# Patient Record
Sex: Female | Born: 1950 | Race: White | Hispanic: No | Marital: Single
Health system: Southern US, Community
[De-identification: ages and names within clinical notes are randomized; demographics above are authoritative.]

---

## 2020-01-27 DIAGNOSIS — Z6828 Body mass index (BMI) 28.0-28.9, adult: Secondary | ICD-10-CM | POA: Diagnosis not present

## 2020-01-27 DIAGNOSIS — I10 Essential (primary) hypertension: Secondary | ICD-10-CM | POA: Diagnosis not present

## 2020-01-27 DIAGNOSIS — E785 Hyperlipidemia, unspecified: Secondary | ICD-10-CM | POA: Diagnosis not present

## 2020-01-27 DIAGNOSIS — Z23 Encounter for immunization: Secondary | ICD-10-CM | POA: Diagnosis not present

## 2020-04-21 DIAGNOSIS — Z1231 Encounter for screening mammogram for malignant neoplasm of breast: Secondary | ICD-10-CM | POA: Diagnosis not present

## 2020-06-27 DIAGNOSIS — Z79899 Other long term (current) drug therapy: Secondary | ICD-10-CM | POA: Diagnosis not present

## 2020-06-27 DIAGNOSIS — E669 Obesity, unspecified: Secondary | ICD-10-CM | POA: Diagnosis not present

## 2020-06-27 DIAGNOSIS — I10 Essential (primary) hypertension: Secondary | ICD-10-CM | POA: Diagnosis not present

## 2020-06-27 DIAGNOSIS — E039 Hypothyroidism, unspecified: Secondary | ICD-10-CM | POA: Diagnosis not present

## 2020-06-27 DIAGNOSIS — E785 Hyperlipidemia, unspecified: Secondary | ICD-10-CM | POA: Diagnosis not present

## 2020-06-27 DIAGNOSIS — Z6827 Body mass index (BMI) 27.0-27.9, adult: Secondary | ICD-10-CM | POA: Diagnosis not present

## 2020-12-02 DIAGNOSIS — M65271 Calcific tendinitis, right ankle and foot: Secondary | ICD-10-CM | POA: Diagnosis not present

## 2020-12-05 DIAGNOSIS — M19072 Primary osteoarthritis, left ankle and foot: Secondary | ICD-10-CM | POA: Diagnosis not present

## 2020-12-05 DIAGNOSIS — M25572 Pain in left ankle and joints of left foot: Secondary | ICD-10-CM | POA: Diagnosis not present

## 2020-12-19 DIAGNOSIS — H6983 Other specified disorders of Eustachian tube, bilateral: Secondary | ICD-10-CM | POA: Diagnosis not present

## 2020-12-19 DIAGNOSIS — I10 Essential (primary) hypertension: Secondary | ICD-10-CM | POA: Diagnosis not present

## 2020-12-19 DIAGNOSIS — Z0000 Encounter for general adult medical examination without abnormal findings: Secondary | ICD-10-CM | POA: Diagnosis not present

## 2020-12-19 DIAGNOSIS — M180 Bilateral primary osteoarthritis of first carpometacarpal joints: Secondary | ICD-10-CM | POA: Diagnosis not present

## 2020-12-19 DIAGNOSIS — E782 Mixed hyperlipidemia: Secondary | ICD-10-CM | POA: Diagnosis not present

## 2020-12-19 DIAGNOSIS — F4321 Adjustment disorder with depressed mood: Secondary | ICD-10-CM | POA: Diagnosis not present

## 2020-12-19 DIAGNOSIS — Z79899 Other long term (current) drug therapy: Secondary | ICD-10-CM | POA: Diagnosis not present

## 2020-12-19 DIAGNOSIS — M75102 Unspecified rotator cuff tear or rupture of left shoulder, not specified as traumatic: Secondary | ICD-10-CM | POA: Diagnosis not present

## 2020-12-19 DIAGNOSIS — E039 Hypothyroidism, unspecified: Secondary | ICD-10-CM | POA: Diagnosis not present

## 2020-12-26 DIAGNOSIS — R109 Unspecified abdominal pain: Secondary | ICD-10-CM | POA: Diagnosis not present

## 2020-12-26 DIAGNOSIS — R1011 Right upper quadrant pain: Secondary | ICD-10-CM | POA: Diagnosis not present

## 2020-12-26 DIAGNOSIS — Z9049 Acquired absence of other specified parts of digestive tract: Secondary | ICD-10-CM | POA: Diagnosis not present

## 2021-01-04 DIAGNOSIS — E785 Hyperlipidemia, unspecified: Secondary | ICD-10-CM | POA: Diagnosis not present

## 2021-01-04 DIAGNOSIS — Z6829 Body mass index (BMI) 29.0-29.9, adult: Secondary | ICD-10-CM | POA: Diagnosis not present

## 2021-01-04 DIAGNOSIS — E039 Hypothyroidism, unspecified: Secondary | ICD-10-CM | POA: Diagnosis not present

## 2021-01-04 DIAGNOSIS — I10 Essential (primary) hypertension: Secondary | ICD-10-CM | POA: Diagnosis not present

## 2021-06-13 DIAGNOSIS — Z79899 Other long term (current) drug therapy: Secondary | ICD-10-CM | POA: Diagnosis not present

## 2021-06-13 DIAGNOSIS — Z6829 Body mass index (BMI) 29.0-29.9, adult: Secondary | ICD-10-CM | POA: Diagnosis not present

## 2021-06-13 DIAGNOSIS — E040 Nontoxic diffuse goiter: Secondary | ICD-10-CM | POA: Diagnosis not present

## 2021-06-13 DIAGNOSIS — R1314 Dysphagia, pharyngoesophageal phase: Secondary | ICD-10-CM | POA: Diagnosis not present

## 2021-06-13 DIAGNOSIS — E039 Hypothyroidism, unspecified: Secondary | ICD-10-CM | POA: Diagnosis not present

## 2021-06-13 DIAGNOSIS — K219 Gastro-esophageal reflux disease without esophagitis: Secondary | ICD-10-CM | POA: Diagnosis not present

## 2021-06-13 DIAGNOSIS — I10 Essential (primary) hypertension: Secondary | ICD-10-CM | POA: Diagnosis not present

## 2021-06-18 DIAGNOSIS — E040 Nontoxic diffuse goiter: Secondary | ICD-10-CM | POA: Diagnosis not present

## 2021-06-18 DIAGNOSIS — R1310 Dysphagia, unspecified: Secondary | ICD-10-CM | POA: Diagnosis not present

## 2021-06-18 DIAGNOSIS — R590 Localized enlarged lymph nodes: Secondary | ICD-10-CM | POA: Diagnosis not present

## 2021-06-18 DIAGNOSIS — E890 Postprocedural hypothyroidism: Secondary | ICD-10-CM | POA: Diagnosis not present

## 2021-06-18 DIAGNOSIS — E041 Nontoxic single thyroid nodule: Secondary | ICD-10-CM | POA: Diagnosis not present

## 2021-06-28 DIAGNOSIS — E041 Nontoxic single thyroid nodule: Secondary | ICD-10-CM | POA: Diagnosis not present

## 2021-06-28 DIAGNOSIS — C73 Malignant neoplasm of thyroid gland: Secondary | ICD-10-CM | POA: Diagnosis not present

## 2021-08-09 DIAGNOSIS — M545 Low back pain, unspecified: Secondary | ICD-10-CM | POA: Diagnosis not present

## 2021-08-21 ENCOUNTER — Ambulatory Visit (INDEPENDENT_AMBULATORY_CARE_PROVIDER_SITE_OTHER): Payer: Medicare PPO | Admitting: Otolaryngology

## 2021-08-21 ENCOUNTER — Other Ambulatory Visit: Payer: Self-pay

## 2021-08-21 DIAGNOSIS — E041 Nontoxic single thyroid nodule: Secondary | ICD-10-CM | POA: Diagnosis not present

## 2021-08-21 NOTE — Progress Notes (Signed)
HPI: Deborah Greene is a 70 y.o. female who presents is referred by [facility] family physicians for evaluation of thyroid nodule.  Apparently patient was having dysphagia and underwent an ultrasound of her thyroid area as her doctors felt like they palpated a mass in this area..  Patient reports that she has had a previous total thyroidectomy performed 27 years ago for thyroid cancer.  On the initial ultrasound performed 2 months ago there was noted to be a hypoechoic nodule measuring 0.7 cm in size that could represent residual thyroid tissue with a small nodule.  An ultrasound guided fine-needle aspirate was recommended and this was performed on 07/06/2021 but they were unable to obtain adequate tissue for diagnosis and was reported as insufficient for diagnosis.  Specimen clinical size measured 0.7 x 0.6 x 0.4cm.  With history of thyroid cancer 27 years ago status post total thyroidectomy. Patient has had no hoarseness.  No past medical history on file. Otherwise negative Social History   Socioeconomic History   Marital status: Single    Spouse name: Not on file   Number of children: Not on file   Years of education: Not on file   Highest education level: Not on file  Occupational History   Not on file  Tobacco Use   Smoking status: Not on file   Smokeless tobacco: Not on file  Substance and Sexual Activity   Alcohol use: Not on file   Drug use: Not on file   Sexual activity: Not on file  Other Topics Concern   Not on file  Social History Narrative   Not on file   Social Determinants of Health   Financial Resource Strain: Not on file  Food Insecurity: Not on file  Transportation Needs: Not on file  Physical Activity: Not on file  Stress: Not on file  Social Connections: Not on file   No family history on file. Not on File Prior to Admission medications   Not on File     Positive ROS: Otherwise negative  All other systems have been reviewed and were otherwise negative with  the exception of those mentioned in the HPI and as above.  Physical Exam: Constitutional: Alert, well-appearing, no acute distress Ears: External ears without lesions or tenderness. Ear canals are clear bilaterally with intact, clear TMs.  Nasal: External nose without lesions. . Clear nasal passages Oral: Lips and gums without lesions. Tongue and palate mucosa without lesions. Posterior oropharynx clear. Neck: On palpation of the neck I do not appreciate any discrete masses or nodules.  No supraclavicular adenopathy noted and no adenopathy noted along the jugular chain of lymph nodes. Respiratory: Breathing comfortably  Skin: No facial/neck lesions or rash noted.  Procedures  Assessment: Small 0.7 cm nodule noted in the bed of the left thyroid region status post thyroidectomy performed 27 years ago for history of thyroid cancer although I have no report on this.  Plan: I will plan on referring the patient to Dr. Gerrit Friends concerning further recommendations and surgery if indicated.   Narda Bonds, MD   CC:

## 2021-08-24 ENCOUNTER — Telehealth (INDEPENDENT_AMBULATORY_CARE_PROVIDER_SITE_OTHER): Payer: Self-pay | Admitting: Otolaryngology

## 2021-08-24 NOTE — Telephone Encounter (Signed)
Called Deborah Greene and left a message for her to call me back next week. I had a chance to review the ultrasound that she had performed with the radiologist who reviewed this with me and this showed some residual thyroid tissue in the left thyroid bed with a small 4 to 6 mm nodule with no bad characteristics on the ultrasound but since she has had history of cancer suggested repeat ultrasound in 6 months to see if it enlarges or removal of the nodule. I have referred her for follow-up with Dr. Gerrit Friends.

## 2021-08-27 ENCOUNTER — Ambulatory Visit (INDEPENDENT_AMBULATORY_CARE_PROVIDER_SITE_OTHER): Payer: Self-pay | Admitting: Otolaryngology

## 2021-08-29 ENCOUNTER — Telehealth (INDEPENDENT_AMBULATORY_CARE_PROVIDER_SITE_OTHER): Payer: Self-pay | Admitting: Otolaryngology

## 2021-08-29 NOTE — Telephone Encounter (Signed)
I talked to Dr. Gerrit Friends as well as to the patient today concerning follow-up on her thyroid nodule.  Reviewed with her that this appeared benign on ultrasound and that Dr. Ardine Eng office will contact her concerning appropriate follow-up.

## 2021-09-12 DIAGNOSIS — E785 Hyperlipidemia, unspecified: Secondary | ICD-10-CM | POA: Diagnosis not present

## 2021-09-12 DIAGNOSIS — Z809 Family history of malignant neoplasm, unspecified: Secondary | ICD-10-CM | POA: Diagnosis not present

## 2021-09-12 DIAGNOSIS — K219 Gastro-esophageal reflux disease without esophagitis: Secondary | ICD-10-CM | POA: Diagnosis not present

## 2021-09-12 DIAGNOSIS — Z8249 Family history of ischemic heart disease and other diseases of the circulatory system: Secondary | ICD-10-CM | POA: Diagnosis not present

## 2021-09-12 DIAGNOSIS — Z833 Family history of diabetes mellitus: Secondary | ICD-10-CM | POA: Diagnosis not present

## 2021-09-12 DIAGNOSIS — I10 Essential (primary) hypertension: Secondary | ICD-10-CM | POA: Diagnosis not present

## 2021-09-12 DIAGNOSIS — H269 Unspecified cataract: Secondary | ICD-10-CM | POA: Diagnosis not present

## 2021-09-12 DIAGNOSIS — Z811 Family history of alcohol abuse and dependence: Secondary | ICD-10-CM | POA: Diagnosis not present

## 2021-09-12 DIAGNOSIS — E039 Hypothyroidism, unspecified: Secondary | ICD-10-CM | POA: Diagnosis not present

## 2022-01-02 ENCOUNTER — Other Ambulatory Visit: Payer: Self-pay | Admitting: Surgery

## 2022-01-02 DIAGNOSIS — E041 Nontoxic single thyroid nodule: Secondary | ICD-10-CM

## 2022-01-03 ENCOUNTER — Ambulatory Visit
Admission: RE | Admit: 2022-01-03 | Discharge: 2022-01-03 | Disposition: A | Discharge status: Home / Self Care | Payer: Medicare PPO | Source: Ambulatory Visit | Attending: Surgery | Admitting: Surgery

## 2022-01-03 DIAGNOSIS — Z79899 Other long term (current) drug therapy: Secondary | ICD-10-CM | POA: Diagnosis not present

## 2022-01-03 DIAGNOSIS — I10 Essential (primary) hypertension: Secondary | ICD-10-CM | POA: Diagnosis not present

## 2022-01-03 DIAGNOSIS — E890 Postprocedural hypothyroidism: Secondary | ICD-10-CM | POA: Diagnosis not present

## 2022-01-03 DIAGNOSIS — C73 Malignant neoplasm of thyroid gland: Secondary | ICD-10-CM | POA: Diagnosis not present

## 2022-01-03 DIAGNOSIS — E785 Hyperlipidemia, unspecified: Secondary | ICD-10-CM | POA: Diagnosis not present

## 2022-01-03 DIAGNOSIS — E039 Hypothyroidism, unspecified: Secondary | ICD-10-CM | POA: Diagnosis not present

## 2022-01-03 DIAGNOSIS — E041 Nontoxic single thyroid nodule: Secondary | ICD-10-CM

## 2022-01-03 DIAGNOSIS — Z1331 Encounter for screening for depression: Secondary | ICD-10-CM | POA: Diagnosis not present

## 2022-01-03 DIAGNOSIS — Z2821 Immunization not carried out because of patient refusal: Secondary | ICD-10-CM | POA: Diagnosis not present

## 2022-01-03 DIAGNOSIS — Z131 Encounter for screening for diabetes mellitus: Secondary | ICD-10-CM | POA: Diagnosis not present

## 2022-01-03 DIAGNOSIS — Z0000 Encounter for general adult medical examination without abnormal findings: Secondary | ICD-10-CM | POA: Diagnosis not present

## 2022-01-30 DIAGNOSIS — Z85850 Personal history of malignant neoplasm of thyroid: Secondary | ICD-10-CM | POA: Diagnosis not present

## 2022-01-30 DIAGNOSIS — R221 Localized swelling, mass and lump, neck: Secondary | ICD-10-CM | POA: Diagnosis not present

## 2022-01-30 DIAGNOSIS — R1310 Dysphagia, unspecified: Secondary | ICD-10-CM | POA: Diagnosis not present

## 2022-01-30 DIAGNOSIS — E890 Postprocedural hypothyroidism: Secondary | ICD-10-CM | POA: Diagnosis not present

## 2022-02-01 DIAGNOSIS — Z1231 Encounter for screening mammogram for malignant neoplasm of breast: Secondary | ICD-10-CM | POA: Diagnosis not present

## 2022-02-21 ENCOUNTER — Other Ambulatory Visit (HOSPITAL_COMMUNITY): Payer: Self-pay | Admitting: *Deleted

## 2022-02-21 DIAGNOSIS — R1310 Dysphagia, unspecified: Secondary | ICD-10-CM

## 2022-02-28 ENCOUNTER — Ambulatory Visit (HOSPITAL_COMMUNITY)
Admission: RE | Admit: 2022-02-28 | Discharge: 2022-02-28 | Disposition: A | Discharge status: Home / Self Care | Payer: Medicare PPO | Source: Ambulatory Visit | Attending: Surgery | Admitting: Surgery

## 2022-02-28 ENCOUNTER — Other Ambulatory Visit: Payer: Self-pay

## 2022-02-28 DIAGNOSIS — Z6829 Body mass index (BMI) 29.0-29.9, adult: Secondary | ICD-10-CM | POA: Diagnosis not present

## 2022-02-28 DIAGNOSIS — R1310 Dysphagia, unspecified: Secondary | ICD-10-CM | POA: Diagnosis not present

## 2022-02-28 DIAGNOSIS — R1319 Other dysphagia: Secondary | ICD-10-CM | POA: Insufficient documentation

## 2022-02-28 DIAGNOSIS — K5900 Constipation, unspecified: Secondary | ICD-10-CM | POA: Diagnosis not present

## 2022-02-28 DIAGNOSIS — K5909 Other constipation: Secondary | ICD-10-CM | POA: Diagnosis not present

## 2022-02-28 DIAGNOSIS — M545 Low back pain, unspecified: Secondary | ICD-10-CM | POA: Diagnosis not present

## 2022-02-28 DIAGNOSIS — R1031 Right lower quadrant pain: Secondary | ICD-10-CM | POA: Diagnosis not present

## 2022-02-28 NOTE — Progress Notes (Signed)
Modified Barium Swallow Progress Note ? ?Patient Details  ?Name: Deborah Greene ?MRN: [id number] ?Date of Birth: September 14, 1951 ? ?Today's Date: 02/28/2022 ? ?Modified Barium Swallow completed.  Full report located under Chart Review in the Imaging Section. ? ?Brief recommendations include the following: ? ?Clinical Impression ? Pt's oropharyngeal swallow was [facility]. She has good timing, efficiency, and safety with no aspiration observed. There were no findings to suggest why she may be having her subjective symptoms. No further SLP f/u indicated at this time - would continue with regular solids and thin liquids as able. ?  ?Swallow Evaluation Recommendations ? ? Recommended Consults: Consider GI evaluation;Consider ENT evaluation ? ? SLP Diet Recommendations: Regular solids;Thin liquid ? ? Liquid Administration via: Cup;Straw ? ? Medication Administration: Whole meds with liquid ? ? Supervision: Patient able to self feed ? ?   ? ?   ? ? Oral Care Recommendations: Oral care BID ? ?   ? ? ? ?Osie Bond., M.A. CCC-SLP ?Acute Rehabilitation Services ?Pager [phone] ?Office [phone] ? ?02/28/2022,11:56 AM ?

## 2022-03-04 NOTE — Progress Notes (Signed)
Swallow study is normal.  No significant abnormality to explain choking sensation.  No restrictions on diet. ? ?tmg ? ?Armandina Gemma, MD ?[facility] ?A [facility] practice ?Office: [phone] ?

## 2022-03-06 DIAGNOSIS — Z6829 Body mass index (BMI) 29.0-29.9, adult: Secondary | ICD-10-CM | POA: Diagnosis not present

## 2022-03-06 DIAGNOSIS — K5909 Other constipation: Secondary | ICD-10-CM | POA: Diagnosis not present

## 2022-03-06 DIAGNOSIS — J302 Other seasonal allergic rhinitis: Secondary | ICD-10-CM | POA: Diagnosis not present

## 2022-03-14 DIAGNOSIS — M5416 Radiculopathy, lumbar region: Secondary | ICD-10-CM | POA: Diagnosis not present

## 2022-03-29 DIAGNOSIS — K581 Irritable bowel syndrome with constipation: Secondary | ICD-10-CM | POA: Diagnosis not present

## 2022-04-05 DIAGNOSIS — M549 Dorsalgia, unspecified: Secondary | ICD-10-CM | POA: Diagnosis not present

## 2022-04-15 DIAGNOSIS — R109 Unspecified abdominal pain: Secondary | ICD-10-CM | POA: Diagnosis not present

## 2022-04-15 DIAGNOSIS — K5900 Constipation, unspecified: Secondary | ICD-10-CM | POA: Diagnosis not present

## 2022-05-02 DIAGNOSIS — M7062 Trochanteric bursitis, left hip: Secondary | ICD-10-CM | POA: Diagnosis not present

## 2022-05-02 DIAGNOSIS — M533 Sacrococcygeal disorders, not elsewhere classified: Secondary | ICD-10-CM | POA: Diagnosis not present

## 2022-05-13 DIAGNOSIS — M533 Sacrococcygeal disorders, not elsewhere classified: Secondary | ICD-10-CM | POA: Diagnosis not present

## 2022-05-13 DIAGNOSIS — M461 Sacroiliitis, not elsewhere classified: Secondary | ICD-10-CM | POA: Diagnosis not present

## 2022-06-10 DIAGNOSIS — M48061 Spinal stenosis, lumbar region without neurogenic claudication: Secondary | ICD-10-CM | POA: Diagnosis not present

## 2022-06-10 DIAGNOSIS — M5116 Intervertebral disc disorders with radiculopathy, lumbar region: Secondary | ICD-10-CM | POA: Diagnosis not present

## 2022-06-10 DIAGNOSIS — M5117 Intervertebral disc disorders with radiculopathy, lumbosacral region: Secondary | ICD-10-CM | POA: Diagnosis not present

## 2022-06-10 DIAGNOSIS — M4726 Other spondylosis with radiculopathy, lumbar region: Secondary | ICD-10-CM | POA: Diagnosis not present

## 2022-06-10 DIAGNOSIS — M5416 Radiculopathy, lumbar region: Secondary | ICD-10-CM | POA: Diagnosis not present

## 2022-06-11 DIAGNOSIS — C73 Malignant neoplasm of thyroid gland: Secondary | ICD-10-CM | POA: Diagnosis not present

## 2022-06-11 DIAGNOSIS — N1832 Chronic kidney disease, stage 3b: Secondary | ICD-10-CM | POA: Diagnosis not present

## 2022-06-11 DIAGNOSIS — G8929 Other chronic pain: Secondary | ICD-10-CM | POA: Diagnosis not present

## 2022-06-11 DIAGNOSIS — E039 Hypothyroidism, unspecified: Secondary | ICD-10-CM | POA: Diagnosis not present

## 2022-06-11 DIAGNOSIS — M5442 Lumbago with sciatica, left side: Secondary | ICD-10-CM | POA: Diagnosis not present

## 2022-06-11 DIAGNOSIS — Z6827 Body mass index (BMI) 27.0-27.9, adult: Secondary | ICD-10-CM | POA: Diagnosis not present

## 2022-06-19 DIAGNOSIS — M5416 Radiculopathy, lumbar region: Secondary | ICD-10-CM | POA: Diagnosis not present

## 2022-07-14 DIAGNOSIS — M545 Low back pain, unspecified: Secondary | ICD-10-CM | POA: Diagnosis not present

## 2022-07-14 DIAGNOSIS — R634 Abnormal weight loss: Secondary | ICD-10-CM | POA: Diagnosis not present

## 2022-07-14 DIAGNOSIS — R110 Nausea: Secondary | ICD-10-CM | POA: Diagnosis not present

## 2022-07-14 DIAGNOSIS — R9431 Abnormal electrocardiogram [ECG] [EKG]: Secondary | ICD-10-CM | POA: Diagnosis not present

## 2022-07-14 DIAGNOSIS — Z882 Allergy status to sulfonamides status: Secondary | ICD-10-CM | POA: Diagnosis not present

## 2022-07-14 DIAGNOSIS — G8929 Other chronic pain: Secondary | ICD-10-CM | POA: Diagnosis not present

## 2022-07-14 DIAGNOSIS — F419 Anxiety disorder, unspecified: Secondary | ICD-10-CM | POA: Diagnosis not present

## 2022-07-14 DIAGNOSIS — R10816 Epigastric abdominal tenderness: Secondary | ICD-10-CM | POA: Diagnosis not present

## 2022-07-14 DIAGNOSIS — R1013 Epigastric pain: Secondary | ICD-10-CM | POA: Diagnosis not present

## 2022-07-15 DIAGNOSIS — R32 Unspecified urinary incontinence: Secondary | ICD-10-CM | POA: Diagnosis not present

## 2022-07-15 DIAGNOSIS — I10 Essential (primary) hypertension: Secondary | ICD-10-CM | POA: Diagnosis not present

## 2022-07-15 DIAGNOSIS — K219 Gastro-esophageal reflux disease without esophagitis: Secondary | ICD-10-CM | POA: Diagnosis not present

## 2022-07-15 DIAGNOSIS — Z809 Family history of malignant neoplasm, unspecified: Secondary | ICD-10-CM | POA: Diagnosis not present

## 2022-07-15 DIAGNOSIS — E890 Postprocedural hypothyroidism: Secondary | ICD-10-CM | POA: Diagnosis not present

## 2022-07-15 DIAGNOSIS — K581 Irritable bowel syndrome with constipation: Secondary | ICD-10-CM | POA: Diagnosis not present

## 2022-07-15 DIAGNOSIS — Z791 Long term (current) use of non-steroidal anti-inflammatories (NSAID): Secondary | ICD-10-CM | POA: Diagnosis not present

## 2022-07-15 DIAGNOSIS — M1990 Unspecified osteoarthritis, unspecified site: Secondary | ICD-10-CM | POA: Diagnosis not present

## 2022-07-15 DIAGNOSIS — E785 Hyperlipidemia, unspecified: Secondary | ICD-10-CM | POA: Diagnosis not present

## 2022-07-17 DIAGNOSIS — M5416 Radiculopathy, lumbar region: Secondary | ICD-10-CM | POA: Diagnosis not present

## 2022-07-19 DIAGNOSIS — J3489 Other specified disorders of nose and nasal sinuses: Secondary | ICD-10-CM | POA: Diagnosis not present

## 2022-07-19 DIAGNOSIS — Z882 Allergy status to sulfonamides status: Secondary | ICD-10-CM | POA: Diagnosis not present

## 2022-07-19 DIAGNOSIS — R109 Unspecified abdominal pain: Secondary | ICD-10-CM | POA: Diagnosis not present

## 2022-07-19 DIAGNOSIS — R002 Palpitations: Secondary | ICD-10-CM | POA: Diagnosis not present

## 2022-07-19 DIAGNOSIS — N281 Cyst of kidney, acquired: Secondary | ICD-10-CM | POA: Diagnosis not present

## 2022-07-19 DIAGNOSIS — M545 Low back pain, unspecified: Secondary | ICD-10-CM | POA: Diagnosis not present

## 2022-07-19 DIAGNOSIS — R634 Abnormal weight loss: Secondary | ICD-10-CM | POA: Diagnosis not present

## 2022-07-19 DIAGNOSIS — N183 Chronic kidney disease, stage 3 unspecified: Secondary | ICD-10-CM | POA: Diagnosis not present

## 2022-07-19 DIAGNOSIS — R112 Nausea with vomiting, unspecified: Secondary | ICD-10-CM | POA: Diagnosis not present

## 2022-07-19 DIAGNOSIS — R531 Weakness: Secondary | ICD-10-CM | POA: Diagnosis not present

## 2022-07-19 DIAGNOSIS — M5440 Lumbago with sciatica, unspecified side: Secondary | ICD-10-CM | POA: Diagnosis not present

## 2022-07-19 DIAGNOSIS — M533 Sacrococcygeal disorders, not elsewhere classified: Secondary | ICD-10-CM | POA: Diagnosis not present

## 2022-07-20 DIAGNOSIS — R1013 Epigastric pain: Secondary | ICD-10-CM | POA: Diagnosis not present

## 2022-07-20 DIAGNOSIS — R110 Nausea: Secondary | ICD-10-CM | POA: Diagnosis not present

## 2022-07-22 DIAGNOSIS — Z6827 Body mass index (BMI) 27.0-27.9, adult: Secondary | ICD-10-CM | POA: Diagnosis not present

## 2022-07-22 DIAGNOSIS — J90 Pleural effusion, not elsewhere classified: Secondary | ICD-10-CM | POA: Diagnosis not present

## 2022-07-22 DIAGNOSIS — R110 Nausea: Secondary | ICD-10-CM | POA: Diagnosis not present

## 2022-07-22 DIAGNOSIS — R634 Abnormal weight loss: Secondary | ICD-10-CM | POA: Diagnosis not present

## 2022-07-23 DIAGNOSIS — M5416 Radiculopathy, lumbar region: Secondary | ICD-10-CM | POA: Diagnosis not present

## 2022-07-23 DIAGNOSIS — M48061 Spinal stenosis, lumbar region without neurogenic claudication: Secondary | ICD-10-CM | POA: Diagnosis not present

## 2022-08-05 DIAGNOSIS — R634 Abnormal weight loss: Secondary | ICD-10-CM | POA: Diagnosis not present

## 2022-08-05 DIAGNOSIS — J984 Other disorders of lung: Secondary | ICD-10-CM | POA: Diagnosis not present

## 2022-08-05 DIAGNOSIS — J90 Pleural effusion, not elsewhere classified: Secondary | ICD-10-CM | POA: Diagnosis not present

## 2022-08-05 DIAGNOSIS — K760 Fatty (change of) liver, not elsewhere classified: Secondary | ICD-10-CM | POA: Diagnosis not present

## 2022-08-12 DIAGNOSIS — M5442 Lumbago with sciatica, left side: Secondary | ICD-10-CM | POA: Diagnosis not present

## 2022-08-12 DIAGNOSIS — Z6827 Body mass index (BMI) 27.0-27.9, adult: Secondary | ICD-10-CM | POA: Diagnosis not present

## 2022-08-12 DIAGNOSIS — E039 Hypothyroidism, unspecified: Secondary | ICD-10-CM | POA: Diagnosis not present

## 2022-08-12 DIAGNOSIS — D692 Other nonthrombocytopenic purpura: Secondary | ICD-10-CM | POA: Diagnosis not present

## 2022-08-12 DIAGNOSIS — M5136 Other intervertebral disc degeneration, lumbar region: Secondary | ICD-10-CM | POA: Diagnosis not present

## 2022-08-12 DIAGNOSIS — G8929 Other chronic pain: Secondary | ICD-10-CM | POA: Diagnosis not present

## 2022-08-19 DIAGNOSIS — M5442 Lumbago with sciatica, left side: Secondary | ICD-10-CM | POA: Diagnosis not present

## 2022-08-19 DIAGNOSIS — M9903 Segmental and somatic dysfunction of lumbar region: Secondary | ICD-10-CM | POA: Diagnosis not present

## 2022-08-21 DIAGNOSIS — M9903 Segmental and somatic dysfunction of lumbar region: Secondary | ICD-10-CM | POA: Diagnosis not present

## 2022-08-21 DIAGNOSIS — M5442 Lumbago with sciatica, left side: Secondary | ICD-10-CM | POA: Diagnosis not present

## 2022-08-26 DIAGNOSIS — M5442 Lumbago with sciatica, left side: Secondary | ICD-10-CM | POA: Diagnosis not present

## 2022-08-26 DIAGNOSIS — M9903 Segmental and somatic dysfunction of lumbar region: Secondary | ICD-10-CM | POA: Diagnosis not present

## 2022-08-27 DIAGNOSIS — M9903 Segmental and somatic dysfunction of lumbar region: Secondary | ICD-10-CM | POA: Diagnosis not present

## 2022-08-27 DIAGNOSIS — M5442 Lumbago with sciatica, left side: Secondary | ICD-10-CM | POA: Diagnosis not present

## 2022-08-29 DIAGNOSIS — M5442 Lumbago with sciatica, left side: Secondary | ICD-10-CM | POA: Diagnosis not present

## 2022-08-29 DIAGNOSIS — M9903 Segmental and somatic dysfunction of lumbar region: Secondary | ICD-10-CM | POA: Diagnosis not present

## 2022-09-03 DIAGNOSIS — M9903 Segmental and somatic dysfunction of lumbar region: Secondary | ICD-10-CM | POA: Diagnosis not present

## 2022-09-03 DIAGNOSIS — M5442 Lumbago with sciatica, left side: Secondary | ICD-10-CM | POA: Diagnosis not present

## 2022-09-04 DIAGNOSIS — M5442 Lumbago with sciatica, left side: Secondary | ICD-10-CM | POA: Diagnosis not present

## 2022-09-04 DIAGNOSIS — M9903 Segmental and somatic dysfunction of lumbar region: Secondary | ICD-10-CM | POA: Diagnosis not present

## 2022-09-10 DIAGNOSIS — M5442 Lumbago with sciatica, left side: Secondary | ICD-10-CM | POA: Diagnosis not present

## 2022-09-10 DIAGNOSIS — M9903 Segmental and somatic dysfunction of lumbar region: Secondary | ICD-10-CM | POA: Diagnosis not present

## 2022-09-16 DIAGNOSIS — Z6827 Body mass index (BMI) 27.0-27.9, adult: Secondary | ICD-10-CM | POA: Diagnosis not present

## 2022-09-16 DIAGNOSIS — M5442 Lumbago with sciatica, left side: Secondary | ICD-10-CM | POA: Diagnosis not present

## 2022-09-16 DIAGNOSIS — M9903 Segmental and somatic dysfunction of lumbar region: Secondary | ICD-10-CM | POA: Diagnosis not present

## 2022-09-16 DIAGNOSIS — R0609 Other forms of dyspnea: Secondary | ICD-10-CM | POA: Diagnosis not present

## 2022-09-16 DIAGNOSIS — M79604 Pain in right leg: Secondary | ICD-10-CM | POA: Diagnosis not present

## 2022-09-16 DIAGNOSIS — M7989 Other specified soft tissue disorders: Secondary | ICD-10-CM | POA: Diagnosis not present

## 2022-09-17 DIAGNOSIS — M79604 Pain in right leg: Secondary | ICD-10-CM | POA: Diagnosis not present

## 2022-09-17 DIAGNOSIS — M79651 Pain in right thigh: Secondary | ICD-10-CM | POA: Diagnosis not present

## 2022-09-17 DIAGNOSIS — M7989 Other specified soft tissue disorders: Secondary | ICD-10-CM | POA: Diagnosis not present

## 2022-09-18 DIAGNOSIS — M5442 Lumbago with sciatica, left side: Secondary | ICD-10-CM | POA: Diagnosis not present

## 2022-09-18 DIAGNOSIS — M9903 Segmental and somatic dysfunction of lumbar region: Secondary | ICD-10-CM | POA: Diagnosis not present

## 2022-09-23 DIAGNOSIS — M5442 Lumbago with sciatica, left side: Secondary | ICD-10-CM | POA: Diagnosis not present

## 2022-09-23 DIAGNOSIS — M9903 Segmental and somatic dysfunction of lumbar region: Secondary | ICD-10-CM | POA: Diagnosis not present

## 2022-09-26 DIAGNOSIS — M5442 Lumbago with sciatica, left side: Secondary | ICD-10-CM | POA: Diagnosis not present

## 2022-09-26 DIAGNOSIS — M9903 Segmental and somatic dysfunction of lumbar region: Secondary | ICD-10-CM | POA: Diagnosis not present

## 2022-10-01 DIAGNOSIS — M5442 Lumbago with sciatica, left side: Secondary | ICD-10-CM | POA: Diagnosis not present

## 2022-10-01 DIAGNOSIS — M9903 Segmental and somatic dysfunction of lumbar region: Secondary | ICD-10-CM | POA: Diagnosis not present

## 2022-10-08 DIAGNOSIS — M9903 Segmental and somatic dysfunction of lumbar region: Secondary | ICD-10-CM | POA: Diagnosis not present

## 2022-10-08 DIAGNOSIS — M5442 Lumbago with sciatica, left side: Secondary | ICD-10-CM | POA: Diagnosis not present

## 2022-10-22 DIAGNOSIS — M5442 Lumbago with sciatica, left side: Secondary | ICD-10-CM | POA: Diagnosis not present

## 2022-10-22 DIAGNOSIS — M9903 Segmental and somatic dysfunction of lumbar region: Secondary | ICD-10-CM | POA: Diagnosis not present

## 2022-11-19 DIAGNOSIS — M9903 Segmental and somatic dysfunction of lumbar region: Secondary | ICD-10-CM | POA: Diagnosis not present

## 2022-11-19 DIAGNOSIS — M5442 Lumbago with sciatica, left side: Secondary | ICD-10-CM | POA: Diagnosis not present

## 2022-12-17 DIAGNOSIS — M5442 Lumbago with sciatica, left side: Secondary | ICD-10-CM | POA: Diagnosis not present

## 2022-12-17 DIAGNOSIS — M9903 Segmental and somatic dysfunction of lumbar region: Secondary | ICD-10-CM | POA: Diagnosis not present

## 2023-01-14 DIAGNOSIS — M9903 Segmental and somatic dysfunction of lumbar region: Secondary | ICD-10-CM | POA: Diagnosis not present

## 2023-01-14 DIAGNOSIS — M5442 Lumbago with sciatica, left side: Secondary | ICD-10-CM | POA: Diagnosis not present

## 2023-01-16 ENCOUNTER — Other Ambulatory Visit: Payer: Self-pay | Admitting: Surgery

## 2023-01-16 DIAGNOSIS — E041 Nontoxic single thyroid nodule: Secondary | ICD-10-CM

## 2023-01-16 DIAGNOSIS — E890 Postprocedural hypothyroidism: Secondary | ICD-10-CM

## 2023-01-16 DIAGNOSIS — Z85850 Personal history of malignant neoplasm of thyroid: Secondary | ICD-10-CM

## 2023-01-23 ENCOUNTER — Ambulatory Visit
Admission: RE | Admit: 2023-01-23 | Discharge: 2023-01-23 | Disposition: A | Discharge status: Home / Self Care | Payer: Medicare PPO | Source: Ambulatory Visit | Attending: Surgery | Admitting: Surgery

## 2023-01-23 DIAGNOSIS — D485 Neoplasm of uncertain behavior of skin: Secondary | ICD-10-CM | POA: Diagnosis not present

## 2023-01-23 DIAGNOSIS — E890 Postprocedural hypothyroidism: Secondary | ICD-10-CM

## 2023-01-23 DIAGNOSIS — D2239 Melanocytic nevi of other parts of face: Secondary | ICD-10-CM | POA: Diagnosis not present

## 2023-01-23 DIAGNOSIS — Z85850 Personal history of malignant neoplasm of thyroid: Secondary | ICD-10-CM

## 2023-01-23 DIAGNOSIS — L570 Actinic keratosis: Secondary | ICD-10-CM | POA: Diagnosis not present

## 2023-01-23 DIAGNOSIS — E041 Nontoxic single thyroid nodule: Secondary | ICD-10-CM

## 2023-01-30 DIAGNOSIS — Z85850 Personal history of malignant neoplasm of thyroid: Secondary | ICD-10-CM | POA: Diagnosis not present

## 2023-01-30 DIAGNOSIS — R221 Localized swelling, mass and lump, neck: Secondary | ICD-10-CM | POA: Diagnosis not present

## 2023-01-30 DIAGNOSIS — Q892 Congenital malformations of other endocrine glands: Secondary | ICD-10-CM | POA: Diagnosis not present

## 2023-01-30 DIAGNOSIS — E890 Postprocedural hypothyroidism: Secondary | ICD-10-CM | POA: Diagnosis not present

## 2023-02-07 DIAGNOSIS — I10 Essential (primary) hypertension: Secondary | ICD-10-CM | POA: Diagnosis not present

## 2023-02-07 DIAGNOSIS — Z0000 Encounter for general adult medical examination without abnormal findings: Secondary | ICD-10-CM | POA: Diagnosis not present

## 2023-02-07 DIAGNOSIS — E785 Hyperlipidemia, unspecified: Secondary | ICD-10-CM | POA: Diagnosis not present

## 2023-02-07 DIAGNOSIS — E039 Hypothyroidism, unspecified: Secondary | ICD-10-CM | POA: Diagnosis not present

## 2023-02-07 DIAGNOSIS — Z79899 Other long term (current) drug therapy: Secondary | ICD-10-CM | POA: Diagnosis not present

## 2023-02-07 DIAGNOSIS — J302 Other seasonal allergic rhinitis: Secondary | ICD-10-CM | POA: Diagnosis not present

## 2023-02-07 DIAGNOSIS — N1832 Chronic kidney disease, stage 3b: Secondary | ICD-10-CM | POA: Diagnosis not present

## 2023-02-07 DIAGNOSIS — C73 Malignant neoplasm of thyroid gland: Secondary | ICD-10-CM | POA: Diagnosis not present

## 2023-02-07 DIAGNOSIS — D692 Other nonthrombocytopenic purpura: Secondary | ICD-10-CM | POA: Diagnosis not present

## 2023-02-11 DIAGNOSIS — M5442 Lumbago with sciatica, left side: Secondary | ICD-10-CM | POA: Diagnosis not present

## 2023-02-11 DIAGNOSIS — M9903 Segmental and somatic dysfunction of lumbar region: Secondary | ICD-10-CM | POA: Diagnosis not present

## 2023-02-13 DIAGNOSIS — Z1231 Encounter for screening mammogram for malignant neoplasm of breast: Secondary | ICD-10-CM | POA: Diagnosis not present

## 2023-03-11 DIAGNOSIS — M5442 Lumbago with sciatica, left side: Secondary | ICD-10-CM | POA: Diagnosis not present

## 2023-03-11 DIAGNOSIS — M9903 Segmental and somatic dysfunction of lumbar region: Secondary | ICD-10-CM | POA: Diagnosis not present

## 2023-04-08 DIAGNOSIS — M9903 Segmental and somatic dysfunction of lumbar region: Secondary | ICD-10-CM | POA: Diagnosis not present

## 2023-04-08 DIAGNOSIS — M5442 Lumbago with sciatica, left side: Secondary | ICD-10-CM | POA: Diagnosis not present

## 2023-05-06 DIAGNOSIS — M9903 Segmental and somatic dysfunction of lumbar region: Secondary | ICD-10-CM | POA: Diagnosis not present

## 2023-05-06 DIAGNOSIS — M5442 Lumbago with sciatica, left side: Secondary | ICD-10-CM | POA: Diagnosis not present

## 2023-06-02 DIAGNOSIS — M5442 Lumbago with sciatica, left side: Secondary | ICD-10-CM | POA: Diagnosis not present

## 2023-06-02 DIAGNOSIS — M9903 Segmental and somatic dysfunction of lumbar region: Secondary | ICD-10-CM | POA: Diagnosis not present

## 2023-06-30 DIAGNOSIS — M5442 Lumbago with sciatica, left side: Secondary | ICD-10-CM | POA: Diagnosis not present

## 2023-06-30 DIAGNOSIS — M9903 Segmental and somatic dysfunction of lumbar region: Secondary | ICD-10-CM | POA: Diagnosis not present

## 2023-07-25 IMAGING — RF DG SWALLOWING FUNCTION
12 of 17 series · 12 of 24 positions shown · non-contrast
Comparison: None.

CLINICAL DATA: Dysphagia.

EXAM:
MODIFIED BARIUM SWALLOW
TECHNIQUE: Different consistencies of barium were administered orally to the
patient by the Speech Pathologist. Imaging of the pharynx was
performed in the lateral projection. The radiologist was present in
the fluoroscopy room for this study, providing personal supervision.
FLUOROSCOPY:
Fluoroscopy Time:  1 minute 50 seconds
Radiation Exposure Index (if provided by the fluoroscopic device):
23.57 mGy

[Series 1: run · 1 of 23 frames shown (1 of 12)]
[frame 20/23]
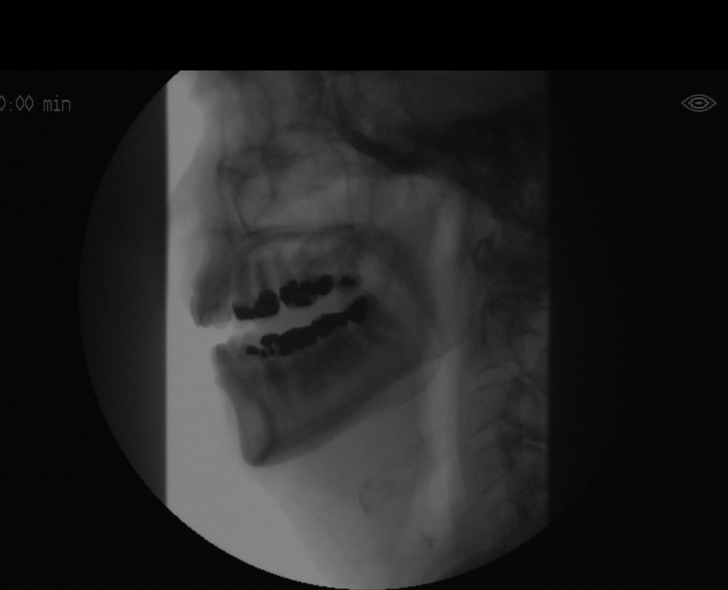

[Series 3: run · 1 of 121 frames shown (2 of 12)]
[frame 23/121]
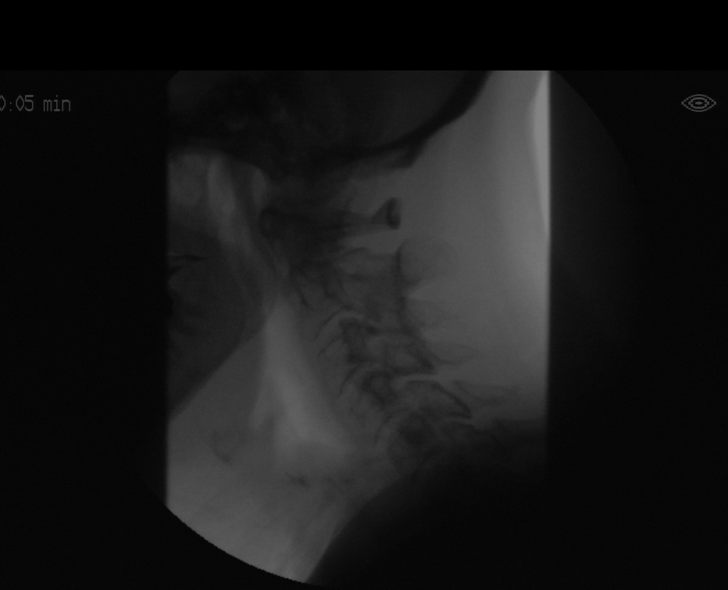

[Series 4: run · 1 of 292 frames shown (3 of 12)]
[frame 249/292]
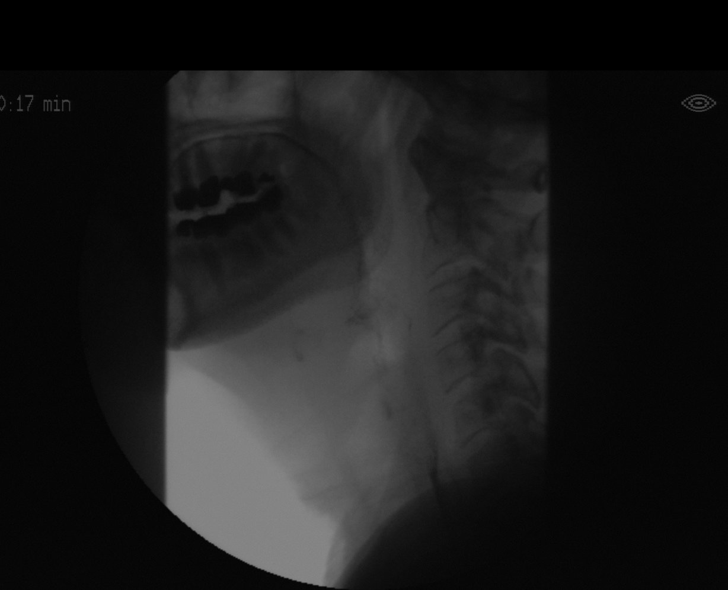

[Series 6: run · 1 of 49 frames shown (4 of 12)]
[frame 8/49]
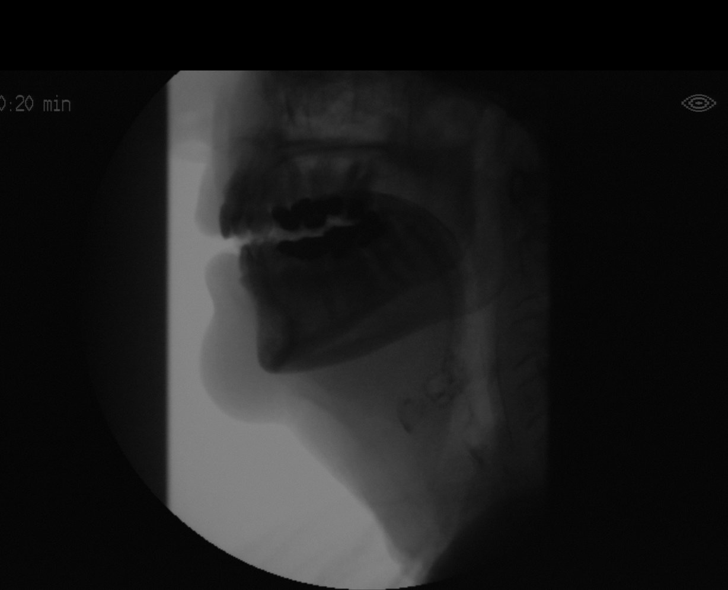

[Series 7: run · 1 of 336 frames shown (5 of 12)]
[frame 169/336]
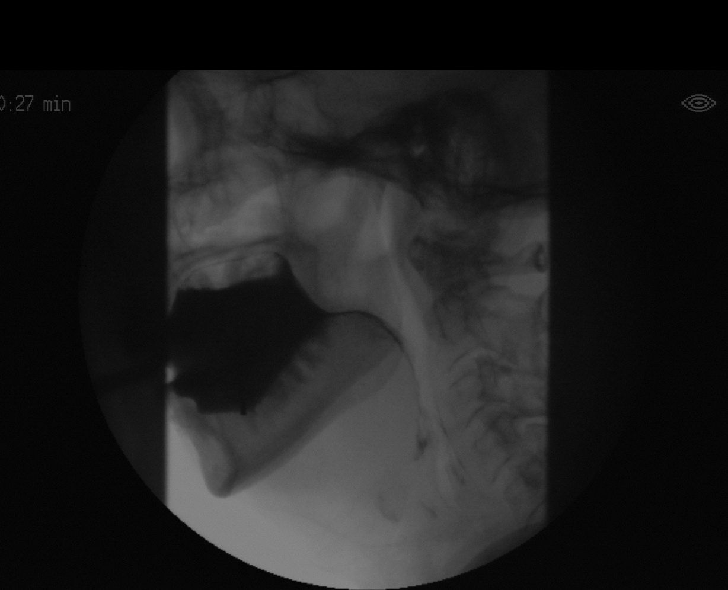

[Series 9: run · 1 of 125 frames shown (6 of 12)]
[frame 19/125]
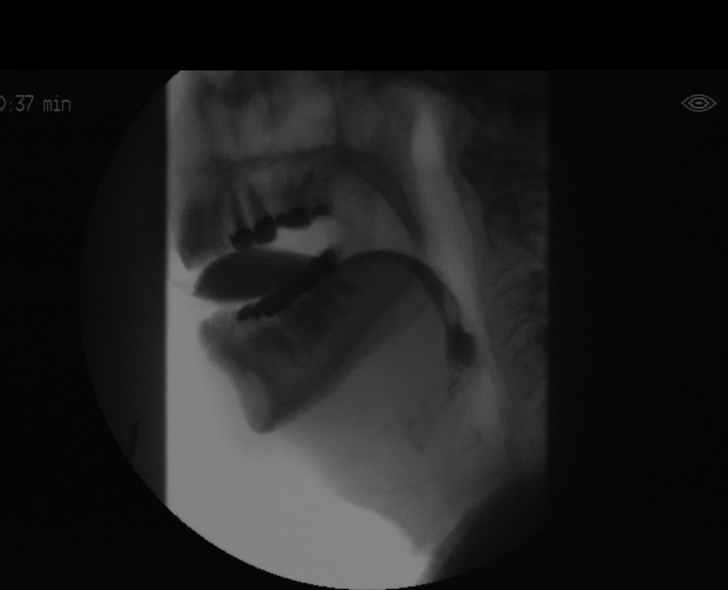

[Series 10: run · 1 of 266 frames shown (7 of 12)]
[frame 187/266]
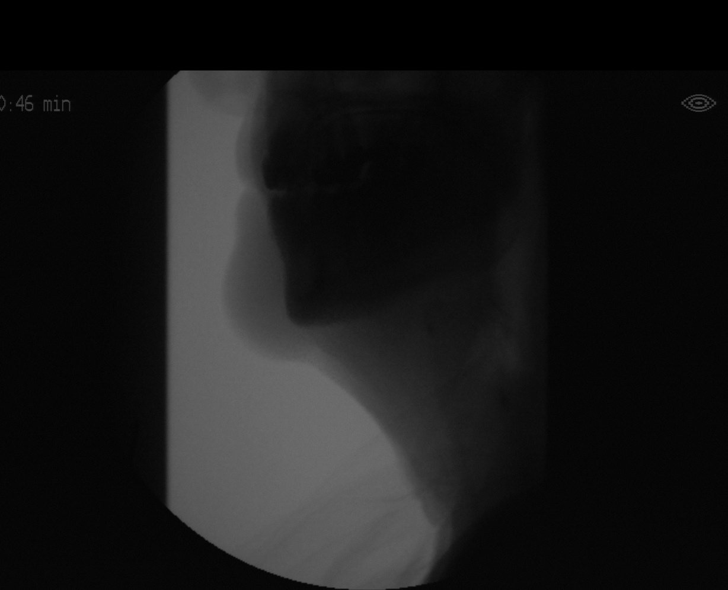

[Series 12: run · 1 of 274 frames shown (8 of 12)]
[frame 42/274]
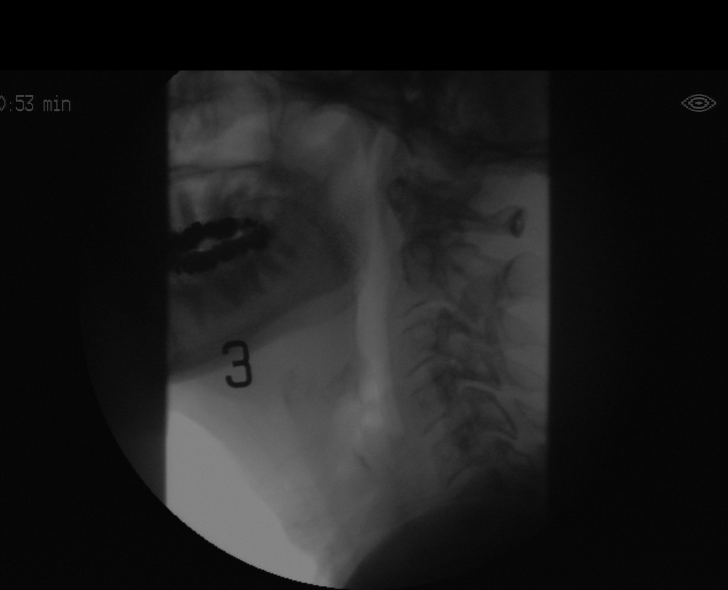

[Series 13: run · 1 of 159 frames shown (9 of 12)]
[frame 136/159]
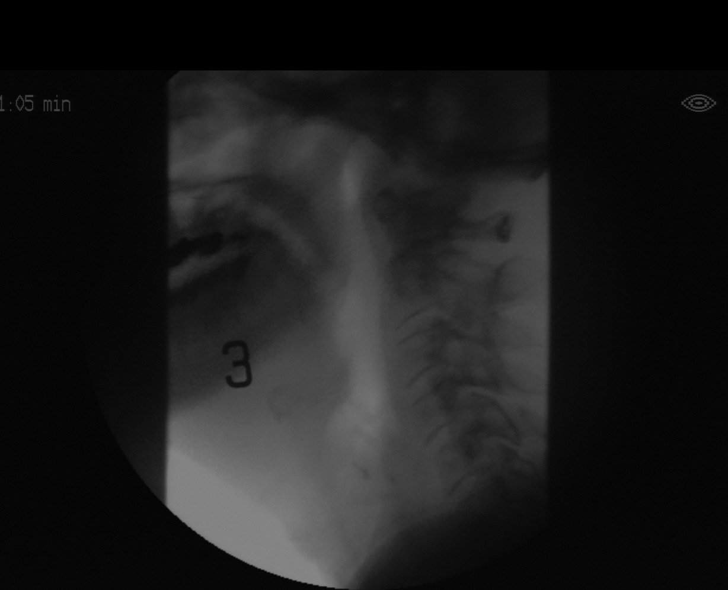

[Series 14: run · 1 of 122 frames shown (10 of 12)]
[frame 104/122]
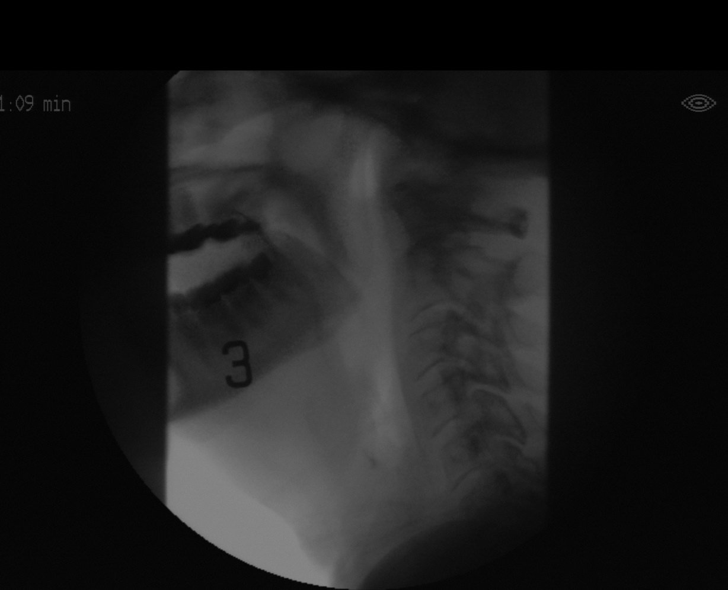

[Series 16: run · 1 of 373 frames shown (11 of 12)]
[frame 127/373]
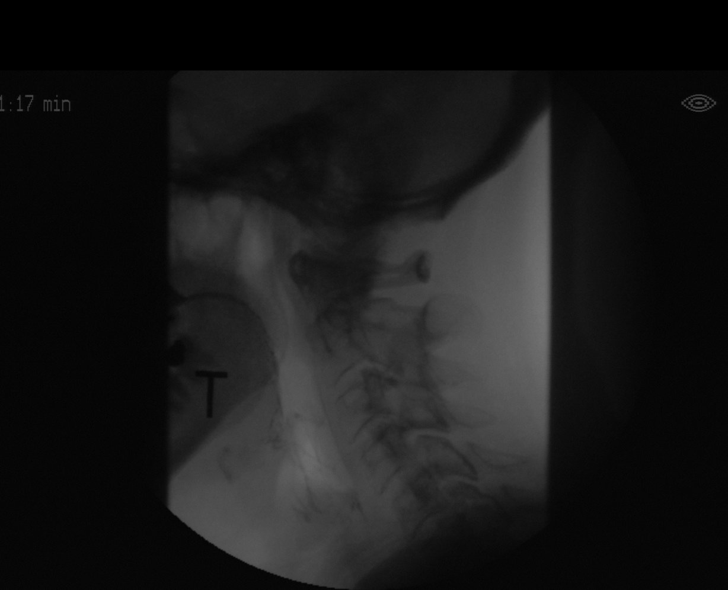

[Series 17: run · 1 of 752 frames shown (12 of 12)]
[frame 704/752]
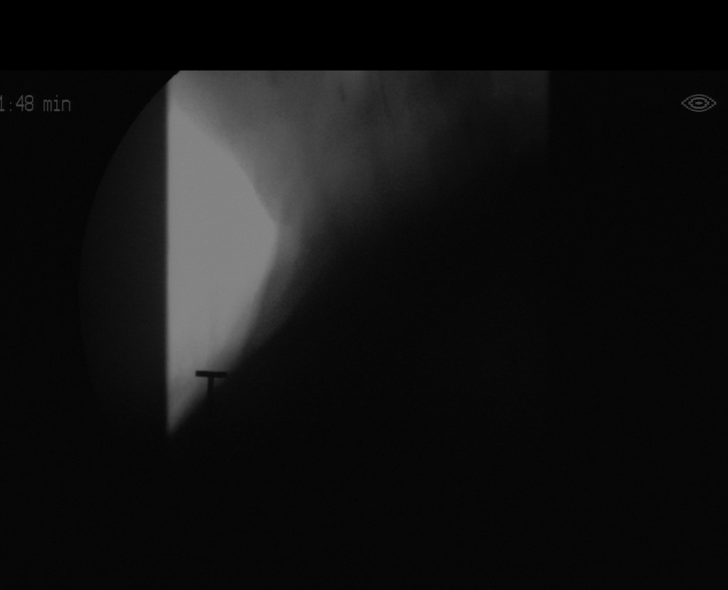

[12 of 24 positions shown; findings below may reference images not displayed]

FINDINGS: No aspiration with attempted consistencies including thin liquid
barium, honey thick, nectar thick, puree, and solid.
IMPRESSION: No aspiration.

Please refer to the Speech Pathologists report for complete details
and recommendations.

## 2023-07-30 DIAGNOSIS — M9903 Segmental and somatic dysfunction of lumbar region: Secondary | ICD-10-CM | POA: Diagnosis not present

## 2023-07-30 DIAGNOSIS — M5442 Lumbago with sciatica, left side: Secondary | ICD-10-CM | POA: Diagnosis not present

## 2023-08-08 DIAGNOSIS — I10 Essential (primary) hypertension: Secondary | ICD-10-CM | POA: Diagnosis not present

## 2023-08-08 DIAGNOSIS — E785 Hyperlipidemia, unspecified: Secondary | ICD-10-CM | POA: Diagnosis not present

## 2023-08-08 DIAGNOSIS — Z79899 Other long term (current) drug therapy: Secondary | ICD-10-CM | POA: Diagnosis not present

## 2023-08-08 DIAGNOSIS — N1832 Chronic kidney disease, stage 3b: Secondary | ICD-10-CM | POA: Diagnosis not present

## 2023-08-08 DIAGNOSIS — D692 Other nonthrombocytopenic purpura: Secondary | ICD-10-CM | POA: Diagnosis not present

## 2023-08-08 DIAGNOSIS — E039 Hypothyroidism, unspecified: Secondary | ICD-10-CM | POA: Diagnosis not present

## 2023-08-08 DIAGNOSIS — C73 Malignant neoplasm of thyroid gland: Secondary | ICD-10-CM | POA: Diagnosis not present

## 2023-08-08 DIAGNOSIS — Z6828 Body mass index (BMI) 28.0-28.9, adult: Secondary | ICD-10-CM | POA: Diagnosis not present

## 2023-08-08 DIAGNOSIS — F4322 Adjustment disorder with anxiety: Secondary | ICD-10-CM | POA: Diagnosis not present

## 2023-08-27 DIAGNOSIS — M5442 Lumbago with sciatica, left side: Secondary | ICD-10-CM | POA: Diagnosis not present

## 2023-08-27 DIAGNOSIS — M9903 Segmental and somatic dysfunction of lumbar region: Secondary | ICD-10-CM | POA: Diagnosis not present

## 2023-09-24 DIAGNOSIS — M5442 Lumbago with sciatica, left side: Secondary | ICD-10-CM | POA: Diagnosis not present

## 2023-09-24 DIAGNOSIS — M9903 Segmental and somatic dysfunction of lumbar region: Secondary | ICD-10-CM | POA: Diagnosis not present

## 2023-10-03 DIAGNOSIS — R300 Dysuria: Secondary | ICD-10-CM | POA: Diagnosis not present

## 2023-10-03 DIAGNOSIS — N3000 Acute cystitis without hematuria: Secondary | ICD-10-CM | POA: Diagnosis not present

## 2023-10-20 DIAGNOSIS — M9903 Segmental and somatic dysfunction of lumbar region: Secondary | ICD-10-CM | POA: Diagnosis not present

## 2023-10-20 DIAGNOSIS — M5442 Lumbago with sciatica, left side: Secondary | ICD-10-CM | POA: Diagnosis not present

## 2023-11-07 DIAGNOSIS — H9201 Otalgia, right ear: Secondary | ICD-10-CM | POA: Diagnosis not present

## 2023-11-07 DIAGNOSIS — R051 Acute cough: Secondary | ICD-10-CM | POA: Diagnosis not present

## 2023-11-07 DIAGNOSIS — R0981 Nasal congestion: Secondary | ICD-10-CM | POA: Diagnosis not present

## 2023-11-17 DIAGNOSIS — M9903 Segmental and somatic dysfunction of lumbar region: Secondary | ICD-10-CM | POA: Diagnosis not present

## 2023-11-17 DIAGNOSIS — M5442 Lumbago with sciatica, left side: Secondary | ICD-10-CM | POA: Diagnosis not present

## 2023-12-04 DIAGNOSIS — M5442 Lumbago with sciatica, left side: Secondary | ICD-10-CM | POA: Diagnosis not present

## 2023-12-04 DIAGNOSIS — M9903 Segmental and somatic dysfunction of lumbar region: Secondary | ICD-10-CM | POA: Diagnosis not present

## 2023-12-09 DIAGNOSIS — M5442 Lumbago with sciatica, left side: Secondary | ICD-10-CM | POA: Diagnosis not present

## 2023-12-09 DIAGNOSIS — M9903 Segmental and somatic dysfunction of lumbar region: Secondary | ICD-10-CM | POA: Diagnosis not present

## 2024-01-06 DIAGNOSIS — M51371 Other intervertebral disc degeneration, lumbosacral region with lower extremity pain only: Secondary | ICD-10-CM | POA: Diagnosis not present

## 2024-01-06 DIAGNOSIS — M5451 Vertebrogenic low back pain: Secondary | ICD-10-CM | POA: Diagnosis not present

## 2024-01-06 DIAGNOSIS — M9903 Segmental and somatic dysfunction of lumbar region: Secondary | ICD-10-CM | POA: Diagnosis not present

## 2024-01-06 DIAGNOSIS — M51362 Other intervertebral disc degeneration, lumbar region with discogenic back pain and lower extremity pain: Secondary | ICD-10-CM | POA: Diagnosis not present

## 2024-01-13 ENCOUNTER — Encounter: Payer: Self-pay | Admitting: Surgery

## 2024-01-13 DIAGNOSIS — E041 Nontoxic single thyroid nodule: Secondary | ICD-10-CM

## 2024-01-13 DIAGNOSIS — E890 Postprocedural hypothyroidism: Secondary | ICD-10-CM

## 2024-02-02 ENCOUNTER — Other Ambulatory Visit: Payer: Self-pay | Admitting: Surgery

## 2024-02-02 DIAGNOSIS — E041 Nontoxic single thyroid nodule: Secondary | ICD-10-CM

## 2024-02-03 DIAGNOSIS — M5451 Vertebrogenic low back pain: Secondary | ICD-10-CM | POA: Diagnosis not present

## 2024-02-03 DIAGNOSIS — M51371 Other intervertebral disc degeneration, lumbosacral region with lower extremity pain only: Secondary | ICD-10-CM | POA: Diagnosis not present

## 2024-02-03 DIAGNOSIS — M9903 Segmental and somatic dysfunction of lumbar region: Secondary | ICD-10-CM | POA: Diagnosis not present

## 2024-02-03 DIAGNOSIS — M51362 Other intervertebral disc degeneration, lumbar region with discogenic back pain and lower extremity pain: Secondary | ICD-10-CM | POA: Diagnosis not present

## 2024-02-04 ENCOUNTER — Ambulatory Visit
Admission: RE | Admit: 2024-02-04 | Discharge: 2024-02-04 | Disposition: A | Discharge status: Home / Self Care | Payer: Medicare PPO | Source: Ambulatory Visit | Attending: Surgery | Admitting: Surgery

## 2024-02-04 DIAGNOSIS — E041 Nontoxic single thyroid nodule: Secondary | ICD-10-CM

## 2024-02-04 DIAGNOSIS — Z9889 Other specified postprocedural states: Secondary | ICD-10-CM | POA: Diagnosis not present

## 2024-02-04 DIAGNOSIS — E890 Postprocedural hypothyroidism: Secondary | ICD-10-CM | POA: Diagnosis not present

## 2024-02-11 DIAGNOSIS — H6992 Unspecified Eustachian tube disorder, left ear: Secondary | ICD-10-CM | POA: Diagnosis not present

## 2024-02-11 DIAGNOSIS — E785 Hyperlipidemia, unspecified: Secondary | ICD-10-CM | POA: Diagnosis not present

## 2024-02-11 DIAGNOSIS — R300 Dysuria: Secondary | ICD-10-CM | POA: Diagnosis not present

## 2024-02-11 DIAGNOSIS — Z0000 Encounter for general adult medical examination without abnormal findings: Secondary | ICD-10-CM | POA: Diagnosis not present

## 2024-02-11 DIAGNOSIS — N1832 Chronic kidney disease, stage 3b: Secondary | ICD-10-CM | POA: Diagnosis not present

## 2024-02-11 DIAGNOSIS — N3001 Acute cystitis with hematuria: Secondary | ICD-10-CM | POA: Diagnosis not present

## 2024-02-11 DIAGNOSIS — R7302 Impaired glucose tolerance (oral): Secondary | ICD-10-CM | POA: Diagnosis not present

## 2024-02-11 DIAGNOSIS — E890 Postprocedural hypothyroidism: Secondary | ICD-10-CM | POA: Diagnosis not present

## 2024-02-11 DIAGNOSIS — Z79899 Other long term (current) drug therapy: Secondary | ICD-10-CM | POA: Diagnosis not present

## 2024-02-11 DIAGNOSIS — C73 Malignant neoplasm of thyroid gland: Secondary | ICD-10-CM | POA: Diagnosis not present

## 2024-02-16 DIAGNOSIS — E039 Hypothyroidism, unspecified: Secondary | ICD-10-CM | POA: Diagnosis not present

## 2024-03-04 DIAGNOSIS — E890 Postprocedural hypothyroidism: Secondary | ICD-10-CM | POA: Diagnosis not present

## 2024-03-04 DIAGNOSIS — Z85850 Personal history of malignant neoplasm of thyroid: Secondary | ICD-10-CM | POA: Diagnosis not present

## 2024-03-11 DIAGNOSIS — N390 Urinary tract infection, site not specified: Secondary | ICD-10-CM | POA: Diagnosis not present

## 2024-03-30 DIAGNOSIS — M9903 Segmental and somatic dysfunction of lumbar region: Secondary | ICD-10-CM | POA: Diagnosis not present

## 2024-03-30 DIAGNOSIS — M51371 Other intervertebral disc degeneration, lumbosacral region with lower extremity pain only: Secondary | ICD-10-CM | POA: Diagnosis not present

## 2024-03-30 DIAGNOSIS — M5451 Vertebrogenic low back pain: Secondary | ICD-10-CM | POA: Diagnosis not present

## 2024-03-30 DIAGNOSIS — M51362 Other intervertebral disc degeneration, lumbar region with discogenic back pain and lower extremity pain: Secondary | ICD-10-CM | POA: Diagnosis not present

## 2024-04-02 DIAGNOSIS — H2513 Age-related nuclear cataract, bilateral: Secondary | ICD-10-CM | POA: Diagnosis not present

## 2024-04-02 DIAGNOSIS — H40013 Open angle with borderline findings, low risk, bilateral: Secondary | ICD-10-CM | POA: Diagnosis not present

## 2024-04-12 DIAGNOSIS — Z1231 Encounter for screening mammogram for malignant neoplasm of breast: Secondary | ICD-10-CM | POA: Diagnosis not present

## 2024-04-26 DIAGNOSIS — M51362 Other intervertebral disc degeneration, lumbar region with discogenic back pain and lower extremity pain: Secondary | ICD-10-CM | POA: Diagnosis not present

## 2024-04-26 DIAGNOSIS — M9903 Segmental and somatic dysfunction of lumbar region: Secondary | ICD-10-CM | POA: Diagnosis not present

## 2024-04-26 DIAGNOSIS — M5451 Vertebrogenic low back pain: Secondary | ICD-10-CM | POA: Diagnosis not present

## 2024-04-26 DIAGNOSIS — M51371 Other intervertebral disc degeneration, lumbosacral region with lower extremity pain only: Secondary | ICD-10-CM | POA: Diagnosis not present

## 2024-04-27 DIAGNOSIS — H259 Unspecified age-related cataract: Secondary | ICD-10-CM | POA: Diagnosis not present

## 2024-04-27 DIAGNOSIS — H25813 Combined forms of age-related cataract, bilateral: Secondary | ICD-10-CM | POA: Diagnosis not present

## 2024-04-27 DIAGNOSIS — H40013 Open angle with borderline findings, low risk, bilateral: Secondary | ICD-10-CM | POA: Diagnosis not present

## 2024-04-27 DIAGNOSIS — H2511 Age-related nuclear cataract, right eye: Secondary | ICD-10-CM | POA: Diagnosis not present

## 2024-05-11 DIAGNOSIS — I10 Essential (primary) hypertension: Secondary | ICD-10-CM | POA: Diagnosis not present

## 2024-05-11 DIAGNOSIS — H43393 Other vitreous opacities, bilateral: Secondary | ICD-10-CM | POA: Diagnosis not present

## 2024-05-11 DIAGNOSIS — E890 Postprocedural hypothyroidism: Secondary | ICD-10-CM | POA: Diagnosis not present

## 2024-05-11 DIAGNOSIS — H2512 Age-related nuclear cataract, left eye: Secondary | ICD-10-CM | POA: Diagnosis not present

## 2024-05-11 DIAGNOSIS — H52223 Regular astigmatism, bilateral: Secondary | ICD-10-CM | POA: Diagnosis not present

## 2024-05-11 DIAGNOSIS — K219 Gastro-esophageal reflux disease without esophagitis: Secondary | ICD-10-CM | POA: Diagnosis not present

## 2024-05-11 DIAGNOSIS — H259 Unspecified age-related cataract: Secondary | ICD-10-CM | POA: Diagnosis not present

## 2024-05-11 DIAGNOSIS — Z85850 Personal history of malignant neoplasm of thyroid: Secondary | ICD-10-CM | POA: Diagnosis not present

## 2024-05-11 DIAGNOSIS — H40013 Open angle with borderline findings, low risk, bilateral: Secondary | ICD-10-CM | POA: Diagnosis not present

## 2024-05-26 DIAGNOSIS — M9903 Segmental and somatic dysfunction of lumbar region: Secondary | ICD-10-CM | POA: Diagnosis not present

## 2024-05-26 DIAGNOSIS — M51362 Other intervertebral disc degeneration, lumbar region with discogenic back pain and lower extremity pain: Secondary | ICD-10-CM | POA: Diagnosis not present

## 2024-05-26 DIAGNOSIS — M5451 Vertebrogenic low back pain: Secondary | ICD-10-CM | POA: Diagnosis not present

## 2024-05-26 DIAGNOSIS — M51371 Other intervertebral disc degeneration, lumbosacral region with lower extremity pain only: Secondary | ICD-10-CM | POA: Diagnosis not present

## 2024-06-08 DIAGNOSIS — D485 Neoplasm of uncertain behavior of skin: Secondary | ICD-10-CM | POA: Diagnosis not present

## 2024-06-08 DIAGNOSIS — L570 Actinic keratosis: Secondary | ICD-10-CM | POA: Diagnosis not present

## 2024-06-22 DIAGNOSIS — M51371 Other intervertebral disc degeneration, lumbosacral region with lower extremity pain only: Secondary | ICD-10-CM | POA: Diagnosis not present

## 2024-06-22 DIAGNOSIS — M9903 Segmental and somatic dysfunction of lumbar region: Secondary | ICD-10-CM | POA: Diagnosis not present

## 2024-06-22 DIAGNOSIS — M5451 Vertebrogenic low back pain: Secondary | ICD-10-CM | POA: Diagnosis not present

## 2024-06-22 DIAGNOSIS — M51362 Other intervertebral disc degeneration, lumbar region with discogenic back pain and lower extremity pain: Secondary | ICD-10-CM | POA: Diagnosis not present

## 2024-07-20 DIAGNOSIS — M9903 Segmental and somatic dysfunction of lumbar region: Secondary | ICD-10-CM | POA: Diagnosis not present

## 2024-07-20 DIAGNOSIS — M5451 Vertebrogenic low back pain: Secondary | ICD-10-CM | POA: Diagnosis not present

## 2024-07-20 DIAGNOSIS — M51371 Other intervertebral disc degeneration, lumbosacral region with lower extremity pain only: Secondary | ICD-10-CM | POA: Diagnosis not present

## 2024-07-20 DIAGNOSIS — M51362 Other intervertebral disc degeneration, lumbar region with discogenic back pain and lower extremity pain: Secondary | ICD-10-CM | POA: Diagnosis not present

## 2024-08-11 DIAGNOSIS — I10 Essential (primary) hypertension: Secondary | ICD-10-CM | POA: Diagnosis not present

## 2024-08-11 DIAGNOSIS — E782 Mixed hyperlipidemia: Secondary | ICD-10-CM | POA: Diagnosis not present

## 2024-08-11 DIAGNOSIS — E890 Postprocedural hypothyroidism: Secondary | ICD-10-CM | POA: Diagnosis not present

## 2024-08-11 DIAGNOSIS — K219 Gastro-esophageal reflux disease without esophagitis: Secondary | ICD-10-CM | POA: Diagnosis not present

## 2024-08-11 DIAGNOSIS — N1832 Chronic kidney disease, stage 3b: Secondary | ICD-10-CM | POA: Diagnosis not present

## 2024-08-11 DIAGNOSIS — C73 Malignant neoplasm of thyroid gland: Secondary | ICD-10-CM | POA: Diagnosis not present

## 2024-08-11 DIAGNOSIS — K921 Melena: Secondary | ICD-10-CM | POA: Diagnosis not present

## 2024-08-11 DIAGNOSIS — K5909 Other constipation: Secondary | ICD-10-CM | POA: Diagnosis not present

## 2024-08-11 DIAGNOSIS — F4322 Adjustment disorder with anxiety: Secondary | ICD-10-CM | POA: Diagnosis not present

## 2024-08-17 DIAGNOSIS — M51362 Other intervertebral disc degeneration, lumbar region with discogenic back pain and lower extremity pain: Secondary | ICD-10-CM | POA: Diagnosis not present

## 2024-08-17 DIAGNOSIS — M5451 Vertebrogenic low back pain: Secondary | ICD-10-CM | POA: Diagnosis not present

## 2024-08-17 DIAGNOSIS — M9903 Segmental and somatic dysfunction of lumbar region: Secondary | ICD-10-CM | POA: Diagnosis not present

## 2024-08-17 DIAGNOSIS — M51371 Other intervertebral disc degeneration, lumbosacral region with lower extremity pain only: Secondary | ICD-10-CM | POA: Diagnosis not present

## 2024-09-14 DIAGNOSIS — M5451 Vertebrogenic low back pain: Secondary | ICD-10-CM | POA: Diagnosis not present

## 2024-09-14 DIAGNOSIS — M51371 Other intervertebral disc degeneration, lumbosacral region with lower extremity pain only: Secondary | ICD-10-CM | POA: Diagnosis not present

## 2024-09-14 DIAGNOSIS — M51362 Other intervertebral disc degeneration, lumbar region with discogenic back pain and lower extremity pain: Secondary | ICD-10-CM | POA: Diagnosis not present

## 2024-09-14 DIAGNOSIS — M9903 Segmental and somatic dysfunction of lumbar region: Secondary | ICD-10-CM | POA: Diagnosis not present

## 2024-10-13 DIAGNOSIS — M5451 Vertebrogenic low back pain: Secondary | ICD-10-CM | POA: Diagnosis not present

## 2024-10-13 DIAGNOSIS — M51362 Other intervertebral disc degeneration, lumbar region with discogenic back pain and lower extremity pain: Secondary | ICD-10-CM | POA: Diagnosis not present

## 2024-10-13 DIAGNOSIS — M9903 Segmental and somatic dysfunction of lumbar region: Secondary | ICD-10-CM | POA: Diagnosis not present

## 2024-10-13 DIAGNOSIS — M51371 Other intervertebral disc degeneration, lumbosacral region with lower extremity pain only: Secondary | ICD-10-CM | POA: Diagnosis not present

## 2024-11-10 DIAGNOSIS — M51371 Other intervertebral disc degeneration, lumbosacral region with lower extremity pain only: Secondary | ICD-10-CM | POA: Diagnosis not present

## 2024-11-10 DIAGNOSIS — M5451 Vertebrogenic low back pain: Secondary | ICD-10-CM | POA: Diagnosis not present

## 2024-11-10 DIAGNOSIS — M9903 Segmental and somatic dysfunction of lumbar region: Secondary | ICD-10-CM | POA: Diagnosis not present

## 2024-11-10 DIAGNOSIS — M51362 Other intervertebral disc degeneration, lumbar region with discogenic back pain and lower extremity pain: Secondary | ICD-10-CM | POA: Diagnosis not present
